# Patient Record
Sex: Female | Born: 1995 | Race: White | Hispanic: No | Marital: Single | Smoking: Never smoker
Health system: Southern US, Community
[De-identification: ages and names within clinical notes are randomized; demographics above are authoritative.]

---
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
## 2014-01-05 IMAGING — NM NM HEPATO W/GB/PHARM/[PERSON_NAME]
2 series · 18 of 18 positions shown · non-contrast
Comparison: None.

CLINICAL DATA: Right upper quadrant pain

NUCLEAR MEDICINE HEPATOBILIARY IMAGING WITH GALLBLADDER EF
TECHNIQUE: Sequential images of the abdomen were obtained [DATE] minutes following intravenous administration of
radiopharmaceutical. After oral ingestion of 8 ounces of Half-and-
Half cream, gallbladder ejection fraction was determined.
Radiopharmaceutical:  5.0 mCi 8c-WWm Choletec

[Series 0: hepato · 3.20mm/px · 6 of 60 frames shown (1 of 2)]
[frame 6/60]
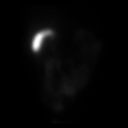
[frame 16/60]
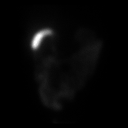
[frame 26/60]
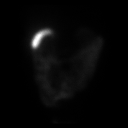
[frame 36/60]
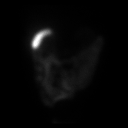
[frame 46/60]
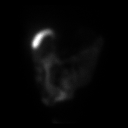
[frame 56/60]
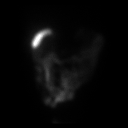

[Series 0: hepato · 3.20mm/px · 2 acquisitions, 12 frames shown (2 of 2)]
[im 1/2]
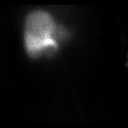
[im 1/2]
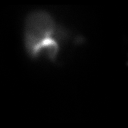
[im 1/2]
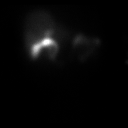
[im 1/2]
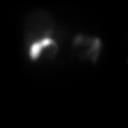
[im 1/2]
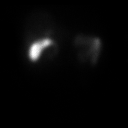
[im 1/2]
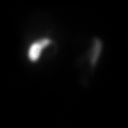
[im 2/2]
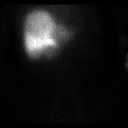
[im 2/2]
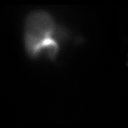
[im 2/2]
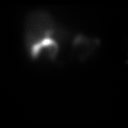
[im 2/2]
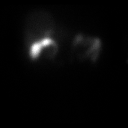
[im 2/2]
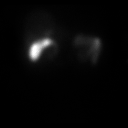
[im 2/2]
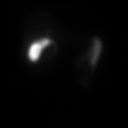

[18 of 18 positions shown; findings below may reference images not displayed]

FINDINGS: Gallbladder activity occurs after 15 minutes.  Small
bowel activity occurs after 12 minutes.  Gallbladder ejection
fraction is 28% after 55 minutes which is below normal.

The patient did not experience symptoms after oral ingestion of
Half-and-Half cream.
IMPRESSION: Cystic and common bile ducts are patent.  Gallbladder ejection
fraction is abnormally low at 28%.

## 2015-11-27 ENCOUNTER — Ambulatory Visit (INDEPENDENT_AMBULATORY_CARE_PROVIDER_SITE_OTHER): Payer: BC Managed Care – PPO | Admitting: Podiatry

## 2015-11-27 ENCOUNTER — Encounter: Payer: Self-pay | Admitting: Podiatry

## 2015-11-27 VITALS — BP 103/65 | HR 67 | Resp 16 | Ht 66.0 in | Wt 135.0 lb

## 2015-11-27 DIAGNOSIS — B351 Tinea unguium: Secondary | ICD-10-CM

## 2015-11-27 MED ORDER — TERBINAFINE HCL 250 MG PO TABS: 250.0000 mg | ORAL_TABLET | Freq: Every day | ORAL | Status: DC

## 2015-11-27 NOTE — Progress Notes (Signed)
   Subjective:    Patient ID: Carol Richardson, female    DOB: January 07, 1996, 20 y.o.   MRN: [id number]  HPI "I used to competitively swim.  I think I picked up something going barefoot, like a fungus.  My nails are discolored, thick and brittle.  I was treated here before for the same problem.  She has also tried vinegar and baking soda.."    Review of Systems     Objective:   Physical Exam        Assessment & Plan:

## 2015-12-26 ENCOUNTER — Ambulatory Visit: Payer: BC Managed Care – PPO

## 2015-12-26 DIAGNOSIS — B351 Tinea unguium: Secondary | ICD-10-CM

## 2015-12-26 NOTE — Progress Notes (Signed)
Pt presents with mycotic infection of nails Lt 1, 4th  All other systems are negative  Laser therapy administered to affected nails and tolerated well. All safety precautions were in place

## 2016-01-17 NOTE — Progress Notes (Signed)
Subjective:     Patient ID: Carol PatchesClaudia Richardson, female   DOB: [birth year], 20 y.o.   MRN: [id number]  HPI patient presents stating that she has had nail disease and has dealt with this several years ago it had been on oral antifungal agent. 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 Patient will initiate her oral medication and we'll start laser therapy approximately 1 month

## 2016-01-23 ENCOUNTER — Other Ambulatory Visit: Payer: BC Managed Care – PPO

## 2016-02-20 ENCOUNTER — Other Ambulatory Visit: Payer: BC Managed Care – PPO

## 2016-02-21 ENCOUNTER — Other Ambulatory Visit: Payer: BC Managed Care – PPO

## 2016-02-21 DIAGNOSIS — B351 Tinea unguium: Secondary | ICD-10-CM

## 2016-02-25 ENCOUNTER — Other Ambulatory Visit: Payer: BC Managed Care – PPO

## 2016-03-19 ENCOUNTER — Ambulatory Visit (INDEPENDENT_AMBULATORY_CARE_PROVIDER_SITE_OTHER): Payer: BC Managed Care – PPO

## 2016-03-19 DIAGNOSIS — B351 Tinea unguium: Secondary | ICD-10-CM

## 2016-03-20 NOTE — Progress Notes (Signed)
Pt presents with mycotic infection of nails Lt 1, 4th  All other systems are negative  Laser therapy administered to affected nails and tolerated well. All safety precautions were in place. Nail 95% clear, she is to come back as needed

## 2017-12-16 ENCOUNTER — Other Ambulatory Visit: Payer: Self-pay

## 2017-12-16 ENCOUNTER — Encounter: Payer: Self-pay | Admitting: Physician Assistant

## 2017-12-16 ENCOUNTER — Ambulatory Visit (INDEPENDENT_AMBULATORY_CARE_PROVIDER_SITE_OTHER): Payer: BC Managed Care – PPO | Admitting: Physician Assistant

## 2017-12-16 VITALS — BP 100/62 | HR 87 | Temp 98.1°F | Resp 20 | Ht 66.25 in | Wt 201.0 lb

## 2017-12-16 DIAGNOSIS — Z7689 Persons encountering health services in other specified circumstances: Secondary | ICD-10-CM

## 2017-12-16 NOTE — Progress Notes (Signed)
Patient ID: Carol Richardson MRN: [id number], DOB: [birth year], 22 y.o. Date of Encounter: @DATE @  Chief Complaint:  Chief Complaint  Patient presents with  . New Patient (Initial Visit)    HPI: 22 y.o. year old female  presents as a new patient to establish care.  Her mother had a visit with me earlier today also as a new patient to establish care.  She is very pleasant.  Mother is now here with Carol Richardson for her appointment. 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 However, she has not received/does not have the forms that need to be completed yet. She states that this upcoming Tuesday she goes to orientation--- may receive those forms and information that day.  I am going to charge her a no charge for today's visit. She will schedule another visit with me once she has the forms that need to be completed.  I have coded her is a no charge today.  Once she has the forms that need to be completed, she will schedule another visit with me. They did bring in paper copy of her immunization record from Dr. Donnie Coffinubin. Our staff will abstract [facility] epic and [facility].    I asked what she has been doing since high school.  She is now age 22.  They state that she went to [employer] for 2 years.  Also has gone to [employer] for some of her classes.  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   I asked if she had any past medical history. Mom reports that she went to Dr. Maryellen Pileavid Rubin as her pediatrician. Vomiting.  Says that they mention something about her gallbladder but that they did not have to remove her gallbladder.  Still has gallbladder.  Carol Richardson says that she avoids red meat etc. and symptoms have been controlled and has not had recurrent headaches or vomiting.  Reports that she has no other past medical history.  Has had no  surgeries. No pertinent family history.  No other concerns to address.   History reviewed. No pertinent past medical history.   Home Meds: Outpatient Medications Prior to Visit  Medication Sig Dispense Refill  . terbinafine (LAMISIL) 250 MG tablet Take 1 tablet (250 mg total) by mouth daily. (Patient not taking: Reported on 11/27/2015) 90 tablet 0  . terbinafine (LAMISIL) 250 MG tablet Take 1 tablet (250 mg total) by mouth daily. 90 tablet 0   No facility-administered medications prior to visit.     Allergies: No Known Allergies  Social History   Socioeconomic History  . Marital status: Single    Spouse name: Not on file  . Number of children: Not on file  . Years of education: Not on file  . Highest education level: Not on file  Occupational History  . Not on file  Social Needs  . Financial resource strain: Not on file  . Food insecurity:    Worry: Not on file    Inability: Not on file  . Transportation needs:    Medical: Not on file    Non-medical: Not on file  Tobacco Use  . Smoking status: Never Smoker  . Smokeless tobacco: Never Used  Substance and Sexual Activity  . Alcohol use: Never    Alcohol/week: 0.0 oz    Frequency: Never  Comment: socially  . Drug use: No  . Sexual activity: Never  Lifestyle  . Physical activity:    Days per week: Not on file    Minutes per session: Not on file  . Stress: Not on file  Relationships  . Social connections:    Talks on phone: Not on file    Gets together: Not on file    Attends religious service: Not on file    Active member of club or organization: Not on file    Attends meetings of clubs or organizations: Not on file    Relationship status: Not on file  . Intimate partner violence:    Fear of current or ex partner: Not on file    Emotionally abused: Not on file    Physically abused: Not on file    Forced sexual activity: Not on file  Other Topics Concern  . Not on file  Social History Narrative  . Not  on file    Family History  Problem Relation Age of Onset  . Hyperlipidemia Maternal Grandfather   . Cancer Paternal Grandmother      Review of Systems:  See HPI for pertinent ROS. All other ROS negative.    Physical Exam: Blood pressure 100/62, pulse 87, temperature 98.1 F (36.7 C), temperature source Oral, resp. rate 20, height 5' 6.25" (1.683 m), weight 91.2 kg (201 lb), last menstrual period 11/15/2017, SpO2 97 %., Body mass index is 32.2 kg/m. General:  WF. Appears in no acute distress. Neck: Supple. No thyromegaly. No lymphadenopathy. Lungs: Clear bilaterally to auscultation without wheezes, rales, or rhonchi. Breathing is unlabored. Heart: RRR with S1 S2. No murmurs, rubs, or gallops. Musculoskeletal:  Strength and tone normal for age. Extremities/Skin: Warm and dry.  Neuro: Alert and oriented X 3. Moves all extremities spontaneously. Gait is normal. CNII-XII grossly in tact. Psych:  Responds to questions appropriately with a normal affect.     ASSESSMENT AND PLAN:  22 y.o. year old female with   1. Encounter to establish care I have coded her is a no charge today.  Once she has the forms that need to be completed, she will schedule another visit with me. They did bring in paper copy of her immunization record from Dr. Donnie Coffin. Our staff will abstract this into epic and [facility] registry.   Signed, [address] [place], [place], [facility] 12/16/2017 11:26 AM

## 2017-12-23 ENCOUNTER — Ambulatory Visit: Payer: BC Managed Care – PPO | Admitting: Physician Assistant

## 2017-12-23 ENCOUNTER — Encounter: Payer: Self-pay | Admitting: Physician Assistant

## 2017-12-23 VITALS — BP 100/78 | HR 82 | Temp 98.7°F | Resp 18 | Ht 66.25 in | Wt 204.6 lb

## 2017-12-23 DIAGNOSIS — Z0289 Encounter for other administrative examinations: Secondary | ICD-10-CM

## 2017-12-23 DIAGNOSIS — Z0000 Encounter for general adult medical examination without abnormal findings: Secondary | ICD-10-CM | POA: Diagnosis not present

## 2017-12-23 DIAGNOSIS — Z020 Encounter for examination for admission to educational institution: Secondary | ICD-10-CM

## 2017-12-23 NOTE — Progress Notes (Signed)
Patient ID: Ernestina PatchesClaudia Piatkowski MRN: [id number], DOB: [birth year], 22 y.o. Date of Encounter: @DATE @  Chief Complaint:  Chief Complaint  Patient presents with  . need forms for school filled out    HPI: 22 y.o. year old female  Presents with above.    12/16/2017: presents as a new patient to establish care.  Her mother had a visit with me earlier today also as a new patient to establish care.  She is very pleasant.  Mother is now here with Debarah Crapelaudia for her appointment. 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 However, she has not received/does not have the forms that need to be completed yet. She states that this upcoming Tuesday she goes to orientation--- may receive those forms and information that day.  I am going to charge her a no charge for today's visit. She will schedule another visit with me once she has the forms that need to be completed.  I have coded her is a no charge today.  Once she has the forms that need to be completed, she will schedule another visit with me. They did bring in paper copy of her immunization record from Dr. Donnie Coffinubin. Our staff will abstract this into epic and [facility].    I asked what she has been doing since high school.  She is now age 22.  They state that she went to [employer] for 2 years.  Also has gone to [employer] for some of her classes.  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   I asked if she had any past medical history. Mom reports that she went to Dr. Maryellen Pileavid Rubin as her pediatrician. Vomiting.  Says that they mention something about her gallbladder but that they did not have to remove her gallbladder.  Still has gallbladder.  Debarah CrapeClaudia says that she avoids red meat etc. and symptoms have been controlled and has not had recurrent headaches or vomiting.  Reports that she has  no other past medical history.  Has had no surgeries. No pertinent family history.  No other concerns to address.  A/P AT THAT OV: 1. Encounter to establish care I have coded her is a no charge today.  Once she has the forms that need to be completed, she will schedule another visit with me. They did bring in paper copy of her immunization record from Dr. Donnie Coffinubin. Our staff will abstract this into epic and [facility] registry.    12/23/2017:  Today she returns. She has forms for school.  No other concerns to address.      History reviewed. No pertinent past medical history.   Home Meds: Outpatient Medications Prior to Visit  Medication Sig Dispense Refill  . ASPIRIN ADULT PO Take by mouth.    . loratadine (CLARITIN) 10 MG tablet Take 10 mg by mouth daily.     No facility-administered medications prior to visit.     Allergies: No Known Allergies  Social History   Socioeconomic History  . Marital status: Single    Spouse name: Not on file  . Number of children: Not on file  . Years of education: Not on file  . Highest education level: Not on file  Occupational History  . Not on file  Social Needs  . Financial resource strain: Not on file  .  Food insecurity:    Worry: Not on file    Inability: Not on file  . Transportation needs:    Medical: Not on file    Non-medical: Not on file  Tobacco Use  . Smoking status: Never Smoker  . Smokeless tobacco: Never Used  Substance and Sexual Activity  . Alcohol use: Never    Alcohol/week: 0.0 oz    Frequency: Never    Comment: socially  . Drug use: No  . Sexual activity: Never  Lifestyle  . Physical activity:    Days per week: Not on file    Minutes per session: Not on file  . Stress: Not on file  Relationships  . Social connections:    Talks on phone: Not on file    Gets together: Not on file    Attends religious service: Not on file    Active member of club or organization: Not on file    Attends meetings  of clubs or organizations: Not on file    Relationship status: Not on file  . Intimate partner violence:    Fear of current or ex partner: Not on file    Emotionally abused: Not on file    Physically abused: Not on file    Forced sexual activity: Not on file  Other Topics Concern  . Not on file  Social History Narrative  . Not on file    Family History  Problem Relation Age of Onset  . Hyperlipidemia Maternal Grandfather   . Cancer Paternal Grandmother      Review of Systems:  See HPI for pertinent ROS. All other ROS negative.    Physical Exam: Blood pressure 100/78, pulse 82, temperature 98.7 F (37.1 C), temperature source Oral, resp. rate 18, height 5' 6.25" (1.683 m), weight 92.8 kg (204 lb 9.6 oz), last menstrual period 11/22/2017, SpO2 98 %., Body mass index is 32.77 kg/m. General: WF. Appears in no acute distress. Head: Normocephalic, atraumatic, eyes without discharge, sclera non-icteric, nares are without discharge. Bilateral auditory canals clear, TM's are without perforation, pearly grey and translucent with reflective cone of light bilaterally. Oral cavity moist, posterior pharynx without erythema. Neck: Supple. No thyromegaly. No lymphadenopathy. Lungs: Clear bilaterally to auscultation without wheezes, rales, or rhonchi. Breathing is unlabored. Heart: RRR with S1 S2. No murmurs, rubs, or gallops. Abdomen: Soft, non-tender, non-distended with normoactive bowel sounds. No hepatomegaly. No rebound/guarding. No obvious abdominal masses. Musculoskeletal:  Strength and tone normal for age. Extremities/Skin: Warm and dry. No clubbing or cyanosis. No edema. No rashes or suspicious lesions. Neuro: Alert and oriented X 3. Moves all extremities spontaneously. Gait is normal. CNII-XII grossly in tact. Psych:  Responds to questions appropriately with a normal affect.     ASSESSMENT AND PLAN:  22 y.o. year old female with    1. Encounter for completion of form with  patient 2. Encounter for school history and physical examination She has completed personal and family history portion of form.  Hearing and Vision screens today are normal. Immunizations are up-to-date.  Check Quantiferon--TB Gold. Remainder of form completed. Will f/u TB lab result.  - QuantiFERON-TB Gold Plus   Signed, [address] [place], [place], [facility] 12/23/2017 7:02 PM

## 2017-12-25 LAB — QUANTIFERON-TB GOLD PLUS
NIL: 0.03 IU/mL
QuantiFERON-TB Gold Plus: NEGATIVE
TB1-NIL: 0.02 [IU]/mL
TB2-NIL: 0.03 [IU]/mL

## 2018-01-05 ENCOUNTER — Telehealth: Payer: Self-pay

## 2018-01-05 NOTE — Telephone Encounter (Signed)
Call placed to patient to inform her that her school forms as well as her immunization records are available for pick up at the front desk.

## 2018-01-10 ENCOUNTER — Ambulatory Visit: Payer: BC Managed Care – PPO | Admitting: Physician Assistant

## 2018-01-10 ENCOUNTER — Encounter: Payer: Self-pay | Admitting: Physician Assistant

## 2018-01-10 VITALS — BP 108/78 | HR 83 | Temp 98.2°F | Resp 20 | Ht 66.25 in | Wt 202.2 lb

## 2018-01-10 DIAGNOSIS — J029 Acute pharyngitis, unspecified: Secondary | ICD-10-CM

## 2018-01-10 DIAGNOSIS — J988 Other specified respiratory disorders: Secondary | ICD-10-CM

## 2018-01-10 DIAGNOSIS — B9689 Other specified bacterial agents as the cause of diseases classified elsewhere: Secondary | ICD-10-CM

## 2018-01-10 MED ORDER — AZITHROMYCIN 250 MG PO TABS: ORAL_TABLET | ORAL | 0 refills | Status: AC

## 2018-01-10 NOTE — Progress Notes (Signed)
    Patient ID: Carol PatchesClaudia Richardson MRN: [id number], DOB: Nov 11, 1995, 22 y.o. Date of Encounter: 01/10/2018, 9:30 AM    Chief Complaint:  Chief Complaint  Patient presents with  . Sore Throat    symptoms for 3 days   . Cough    phlegm green     HPI: 22 y.o. year old female presents with above.   Her mom accompanies her for visit.  Reports that patient's father has also been sick for the last 5 days with congestion etc.  Thinks that patient has picked up germ from him.  Dates that she is coughing up thick yellow-green phlegm and having some nasal congestion and thick mucus from the nose.  Also with her throat has been sore for several days.  She states that she is taking a Mucinex that is for cough.  Had no known fevers or chills.  No other concerns to address.     Home Meds:   Outpatient Medications Prior to Visit  Medication Sig Dispense Refill  . ASPIRIN ADULT PO Take by mouth.    . loratadine (CLARITIN) 10 MG tablet Take 10 mg by mouth daily.     No facility-administered medications prior to visit.     Allergies: No Known Allergies    Review of Systems: See HPI for pertinent ROS. All other ROS negative.    Physical Exam: Blood pressure 108/78, pulse 83, temperature 98.2 F (36.8 C), temperature source Oral, resp. rate 20, height 5' 6.25" (1.683 m), weight 91.7 kg (202 lb 3.2 oz), last menstrual period 12/27/2017, SpO2 94 %., There is no height or weight on file to calculate BMI. General:  WNWD WF. Appears in no acute distress. HEENT: Normocephalic, atraumatic, eyes without discharge, sclera non-icteric, nares are without discharge. Bilateral auditory canals clear, TM's are without perforation, pearly grey and translucent with reflective cone of light bilaterally. Oral cavity moist, posterior pharynx with moderate erythema of the posterior pharynx and bilateral pharynx.  There is no exudate, no peritonsillar abscess. Neck: Supple. No thyromegaly. No lymphadenopathy. Lungs:  Clear bilaterally to auscultation without wheezes, rales, or rhonchi. Breathing is unlabored. Heart: Regular rhythm. No murmurs, rubs, or gallops. Msk:  Strength and tone normal for age. Extremities/Skin: Warm and dry. No rash. Neuro: Alert and oriented X 3. Moves all extremities spontaneously. Gait is normal. CNII-XII grossly in tact. Psych:  Responds to questions appropriately with a normal affect.     ASSESSMENT AND PLAN:  22 y.o. year old female with   1. Bacterial respiratory infection Discussed that rapid strep test negative.  Symptoms and findings consistent with bacterial respiratory infection.  She is to take the antibiotic as directed to kill off infection.  Can continue over-the-counter medicines for symptom relief if needed in the interim.  Follow-up if symptoms do not resolve within 1 week after completion of antibiotics. - azithromycin (ZITHROMAX) 250 MG tablet; Day 1: Take 2 daily.  Days 2 -5: Take 1 daily.  Dispense: 6 tablet; Refill: 0  2. Sore throat Rapid strep test negative. - STREP GROUP A AG, W/REFLEX TO CULT    Signed, [address], [place], [facility] 01/10/2018 9:30 AM

## 2018-01-12 LAB — CULTURE, GROUP A STREP
MICRO NUMBER: 90834491
SPECIMEN QUALITY: ADEQUATE

## 2018-01-12 LAB — STREP GROUP A AG, W/REFLEX TO CULT: STREPTOCOCCUS, GROUP A SCREEN (DIRECT): NOT DETECTED

## 2019-03-28 ENCOUNTER — Other Ambulatory Visit: Payer: Self-pay

## 2019-03-28 ENCOUNTER — Other Ambulatory Visit: Payer: BC Managed Care – PPO

## 2019-03-28 DIAGNOSIS — Z111 Encounter for screening for respiratory tuberculosis: Secondary | ICD-10-CM

## 2019-03-30 LAB — QUANTIFERON-TB GOLD PLUS
Mitogen-NIL: >10 IU/mL
NIL: 0.03 IU/mL
QuantiFERON-TB Gold Plus: NEGATIVE
TB1-NIL: 0.05 IU/mL
TB2-NIL: 0.07 IU/mL

## 2019-04-13 ENCOUNTER — Encounter: Payer: Self-pay | Admitting: *Deleted

## 2019-10-20 ENCOUNTER — Ambulatory Visit: Payer: Self-pay | Attending: Internal Medicine

## 2019-10-20 DIAGNOSIS — Z23 Encounter for immunization: Secondary | ICD-10-CM

## 2019-10-20 NOTE — Progress Notes (Signed)
   Covid-19 Vaccination Clinic  Name:  Carol Richardson    MRN: [id number] DOB: [birth year]  10/20/2019  [name] was observed post Covid-19 immunization for 15 minutes without incident. She was provided with Vaccine Information Sheet and instruction to access the V-Safe system.   [name] was instructed to call 911 with any severe reactions post vaccine: Marland Kitchen Difficulty breathing  . Swelling of face and throat  . A fast heartbeat  . A bad rash all over body  . Dizziness and weakness   Immunizations Administered    Name Date Dose VIS Date Route   Pfizer COVID-19 Vaccine 10/20/2019 11:23 AM 0.3 mL 08/23/2018 Intramuscular   Manufacturer: [employer], [employer]   Lot: [id number]   NDC: [address]-6

## 2019-11-13 ENCOUNTER — Ambulatory Visit: Payer: Self-pay | Attending: Internal Medicine

## 2019-11-13 DIAGNOSIS — Z23 Encounter for immunization: Secondary | ICD-10-CM

## 2019-11-13 NOTE — Progress Notes (Signed)
   Covid-19 Vaccination Clinic  Name:  Carol Richardson    MRN: [id number] DOB: Jul 29, 1995  11/13/2019  [name] was observed post Covid-19 immunization for 15 minutes without incident. She was provided with Vaccine Information Sheet and instruction to access the V-Safe system.   [name] was instructed to call 911 with any severe reactions post vaccine: Marland Kitchen Difficulty breathing  . Swelling of face and throat  . A fast heartbeat  . A bad rash all over body  . Dizziness and weakness   Immunizations Administered    Name Date Dose VIS Date Route   Pfizer COVID-19 Vaccine 11/13/2019 11:17 AM 0.3 mL 08/23/2018 Intramuscular   Manufacturer: [employer], [employer]   Lot: [id number]   NDC: [address]-0

## 2020-02-05 ENCOUNTER — Encounter: Payer: Self-pay | Admitting: Podiatry

## 2020-02-05 ENCOUNTER — Other Ambulatory Visit: Payer: Self-pay

## 2020-02-05 ENCOUNTER — Ambulatory Visit: Payer: BC Managed Care – PPO | Admitting: Podiatry

## 2020-02-05 DIAGNOSIS — B351 Tinea unguium: Secondary | ICD-10-CM | POA: Diagnosis not present

## 2020-02-05 MED ORDER — TERBINAFINE HCL 250 MG PO TABS: 250.0000 mg | ORAL_TABLET | Freq: Every day | ORAL | 0 refills | Status: AC

## 2020-02-05 NOTE — Progress Notes (Signed)
Subjective:   Patient ID: Carol Richardson, female   DOB: 24 y.o.   MRN: [id number]   HPI Patient presents stating I have thickness of my right big toenail its been going on for about 4 years and I had previous laser about 4 years ago and I am interested in treatment for this condition   Review of Systems  All other systems reviewed and are negative.       Objective:  Physical Exam Vitals and nursing note reviewed.  Constitutional:      Appearance: She is well-developed.  Pulmonary:     Effort: Pulmonary effort is normal.  Musculoskeletal:        General: Normal range of motion.  Skin:    General: Skin is warm.  Neurological:     Mental Status: She is alert.     Neurovascular status intact muscle strength adequate with patient found to have a thickened right hallux nail that is been probably traumatized about 4 years ago with the distal one half of the nailbed involved.  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  We discussed different treatment options and she is opted for oral and laser and I educated her on this and she is scheduled for laser and I wrote her prescription for Lamisil 1 pill/day for 90 days explaining risk with this and I am sending for liver function studies now

## 2020-02-07 LAB — HEPATIC FUNCTION PANEL
AG Ratio: 1.9 (calc) (ref 1.0–2.5)
ALT: 20 U/L (ref 6–29)
AST: 16 U/L (ref 10–30)
Albumin: 4.3 g/dL (ref 3.6–5.1)
Alkaline phosphatase (APISO): 97 U/L (ref 31–125)
Bilirubin, Direct: 0.1 mg/dL (ref 0.0–0.2)
Globulin: 2.3 g/dL (calc) (ref 1.9–3.7)
Indirect Bilirubin: 0.4 mg/dL (calc) (ref 0.2–1.2)
Total Bilirubin: 0.5 mg/dL (ref 0.2–1.2)
Total Protein: 6.6 g/dL (ref 6.1–8.1)

## 2020-02-13 ENCOUNTER — Telehealth: Payer: Self-pay | Admitting: Podiatry

## 2020-02-13 NOTE — Telephone Encounter (Signed)
Patient was seen in office on 02/05/2020 for nail fungus and was sent for lab work to test her levels before beginning Lamisil. Patient would like to know if the results are back in and if she is able to start the medication.  Please give patient a call.

## 2020-02-14 ENCOUNTER — Telehealth: Payer: Self-pay | Admitting: *Deleted

## 2020-02-14 NOTE — Telephone Encounter (Signed)
Called patient back to let her know her lab results (hepatic panel) came back normal and that she could start her Lamisil medicine now.  Patient stated, "I am going to the beach next week and it says that Lamisil is sensitive to the sun". I advised patient to hold off taking her Lamisil until she gets back from the beach.  Patient stated she understood.

## 2020-02-15 ENCOUNTER — Ambulatory Visit (INDEPENDENT_AMBULATORY_CARE_PROVIDER_SITE_OTHER): Payer: BC Managed Care – PPO | Admitting: *Deleted

## 2020-02-15 ENCOUNTER — Other Ambulatory Visit: Payer: Self-pay

## 2020-02-15 DIAGNOSIS — B351 Tinea unguium: Secondary | ICD-10-CM

## 2020-02-15 NOTE — Progress Notes (Signed)
Patient presents today for the 1st (2nd round) laser treatment. Diagnosed with mycotic nail infection by Dr. Charlsie Merles.   Toenail most affected hallux right.  All other systems are negative.  Nails were filed thin. Laser therapy was administered to 1st toenails right and patient tolerated the treatment well. All safety precautions were in place.   Patient was prescribed terbinafine #90, but has not started yet. She wants to wait until she gets back from the beach.   Follow up in 6 weeks for laser # 2.  Picture of nails taken today to document visual progress  ~Dr. Charlsie Merles wants patient to have 3 lasers total~

## 2020-03-05 ENCOUNTER — Ambulatory Visit: Payer: BC Managed Care – PPO | Admitting: Family Medicine

## 2020-03-18 ENCOUNTER — Other Ambulatory Visit: Payer: Self-pay

## 2020-03-18 ENCOUNTER — Ambulatory Visit (INDEPENDENT_AMBULATORY_CARE_PROVIDER_SITE_OTHER): Payer: BC Managed Care – PPO | Admitting: *Deleted

## 2020-03-18 DIAGNOSIS — B351 Tinea unguium: Secondary | ICD-10-CM

## 2020-03-18 NOTE — Progress Notes (Signed)
Patient presents today for the 2nd (2nd round) laser treatment. Diagnosed with mycotic nail infection by Dr. Charlsie Merles.   Toenail most affected hallux right.  All other systems are negative.  Nails were filed thin. Laser therapy was administered to 1st toenails right and patient tolerated the treatment well. All safety precautions were in place.   Patient was prescribed terbinafine #90. She has started the medication. She delayed in starting it until she come back from her beach trip.   Follow up in 6 weeks for laser # 3.   ~Dr. Charlsie Merles wants patient to have 3 lasers total~

## 2020-04-12 ENCOUNTER — Ambulatory Visit (INDEPENDENT_AMBULATORY_CARE_PROVIDER_SITE_OTHER): Payer: BC Managed Care – PPO | Admitting: *Deleted

## 2020-04-12 ENCOUNTER — Other Ambulatory Visit: Payer: Self-pay

## 2020-04-12 DIAGNOSIS — B351 Tinea unguium: Secondary | ICD-10-CM

## 2020-04-12 NOTE — Progress Notes (Signed)
Patient presents today for the 3rd (2nd round) laser treatment. Diagnosed with mycotic nail infection by Dr. Charlsie Merles.   Toenail most affected hallux right. The nail is clearing up quite a bit.  All other systems are negative.  Nails were filed thin. Laser therapy was administered to 1st toenails right and patient tolerated the treatment well. All safety precautions were in place.   Patient was prescribed terbinafine #90. She has started the medication. She delayed in starting it until she come back from her beach trip. She has about 2 months of the medication left.  Patient has completed the recommended laser treatments. She will follow up with Dr. Charlsie Merles in 3 months to evaluate progress.    ~Final nail pic taken today~

## 2020-07-18 ENCOUNTER — Encounter: Payer: Self-pay | Admitting: Podiatry

## 2020-07-18 ENCOUNTER — Other Ambulatory Visit: Payer: Self-pay

## 2020-07-18 ENCOUNTER — Ambulatory Visit: Payer: BC Managed Care – PPO | Admitting: Podiatry

## 2020-07-18 DIAGNOSIS — B351 Tinea unguium: Secondary | ICD-10-CM

## 2020-07-18 NOTE — Progress Notes (Signed)
Subjective:   Patient ID: Carol Richardson, female   DOB: 25 y.o.   MRN: [id number]   HPI Patient states she thinks her nail has improved but she still has a patch that bothers her and is concerned   ROS      Objective:  Physical Exam  Neurovascular status intact with patient's right hallux on the lateral side still exhibiting some discoloration and slight thickness with localized irritation of the tissue.  Is not having pain and completed her oral antifungal several months ago     Assessment:  Patient is doing well with mycotic infection of the right hallux lateral side with significant improvement from the beginning of treatment     Plan:  Reviewed with patient and at 1 point in future we may need to consider a pulse therapy.  At this point we are going to have 1 more laser done and she will begin topical formula 7 we will give it 4 months and if symptoms are persisting we will consider oral medication pulse

## 2020-07-30 ENCOUNTER — Telehealth: Payer: Self-pay | Admitting: *Deleted

## 2020-07-30 NOTE — Telephone Encounter (Signed)
Patient is calling to let Dr Charlsie Merles know that she has stop using the formula #7 topical after noticing burning all night, discoloration,inflammation,swelling and bruising around the actual skin after applying.This lasted about 1 week. Please advise.

## 2020-08-16 ENCOUNTER — Other Ambulatory Visit: Payer: Self-pay

## 2020-08-16 ENCOUNTER — Ambulatory Visit (INDEPENDENT_AMBULATORY_CARE_PROVIDER_SITE_OTHER): Payer: BC Managed Care – PPO | Admitting: *Deleted

## 2020-08-16 DIAGNOSIS — B351 Tinea unguium: Secondary | ICD-10-CM

## 2020-08-16 NOTE — Progress Notes (Signed)
Patient presents today for the 4th (2nd round) laser treatment. Diagnosed with mycotic nail infection by Dr. Charlsie Merles.   Toenail most affected hallux right. She saw Dr. Charlsie Merles for follow up and still has come discoloration in the nail. He wanted her to try Formula 7 and one more laser treatment. However, patient states when using for 1 week she had a lot of irritation and redness to the medial border of the toe.  All other systems are negative.  Nails were filed thin. Laser therapy was administered to 1st toenails right and patient tolerated the treatment well. All safety precautions were in place.   The medial border is very sore and red. I did cut a little portion of nail. I told her this looked like more of an ingrown vs irritation from the topical medication. Advised she could soak and use neosporin on the toe daily. If no better in a few weeks call and schedule an appointment with Dr. Charlsie Merles.   She will follow up for possible laser treatment in 8 weeks.  ~Pic of nail taken today~

## 2020-10-18 ENCOUNTER — Other Ambulatory Visit: Payer: BC Managed Care – PPO

## 2020-10-24 ENCOUNTER — Other Ambulatory Visit: Payer: Self-pay | Admitting: Podiatry

## 2020-10-24 NOTE — Telephone Encounter (Signed)
Please advise 

## 2020-10-24 NOTE — Telephone Encounter (Signed)
Do not refill again

## 2020-10-25 ENCOUNTER — Other Ambulatory Visit: Payer: Self-pay

## 2020-10-25 ENCOUNTER — Ambulatory Visit (INDEPENDENT_AMBULATORY_CARE_PROVIDER_SITE_OTHER): Payer: BC Managed Care – PPO

## 2020-10-25 DIAGNOSIS — B351 Tinea unguium: Secondary | ICD-10-CM

## 2020-10-25 NOTE — Progress Notes (Signed)
Patient presents today for the 5th (2nd round) laser treatment. Diagnosed with mycotic nail infection by Dr. Charlsie Merles.   Toenail most affected hallux right. She saw Dr. Charlsie Merles for follow up and still has come discoloration in the nail. He wanted her to try Formula 7 and one more laser treatment. However, patient states when using for 1 week she had a lot of irritation and redness to the medial border of the toe.  All other systems are negative.  Nails were filed thin. Laser therapy was administered to 1st toenails right and patient tolerated the treatment well. All safety precautions were in place.   The medial border is very sore and red. I did cut a little portion of nail. I told her this looked like more of an ingrown vs irritation from the topical medication. Advised she could soak and use neosporin on the toe daily. If no better in a few weeks call and schedule an appointment with Dr. Charlsie Merles.   Follow up with Dr Charlsie Merles in 3 months

## 2020-10-25 NOTE — Patient Instructions (Signed)

## 2020-10-28 ENCOUNTER — Other Ambulatory Visit: Payer: BC Managed Care – PPO

## 2020-10-28 ENCOUNTER — Telehealth: Payer: Self-pay | Admitting: Podiatry

## 2020-10-28 NOTE — Telephone Encounter (Signed)
Patient had a laser done in the office on Friday, she stated that her toe is red and puffy around the nail area where it was cut. Patient states it hurts to walk Please call patient.

## 2020-10-29 NOTE — Telephone Encounter (Signed)
Patient has complained about the same problem the last 2 laser treatments and was advised to make an appointment with the provider if soaking in epsom salt did not relieve the discomfort. The redness and swollen area may be due to an ingrown toenail. She needs to be evaluated by one of our providers.

## 2020-10-31 ENCOUNTER — Other Ambulatory Visit: Payer: Self-pay

## 2020-10-31 ENCOUNTER — Ambulatory Visit (INDEPENDENT_AMBULATORY_CARE_PROVIDER_SITE_OTHER): Payer: BC Managed Care – PPO | Admitting: Podiatry

## 2020-10-31 DIAGNOSIS — B351 Tinea unguium: Secondary | ICD-10-CM

## 2020-10-31 MED ORDER — TERBINAFINE HCL 250 MG PO TABS: 250.0000 mg | ORAL_TABLET | Freq: Every day | ORAL | 0 refills | Status: AC

## 2020-10-31 NOTE — Patient Instructions (Signed)

## 2020-11-04 NOTE — Progress Notes (Signed)
Subjective:   Patient ID: Carol Richardson, female   DOB: 25 y.o.   MRN: [id number]   HPI Patient states she has some irritation around the right big toe with redness noted and has an incurvated nail border that also can be bothersome with history of fungal infiltration with   ROS      Objective:  Physical Exam  Neurovascular status intact with patient right hallux healing slight redness in the corner localized no active drainage noted     Assessment:  Fungal infection irritation around the tissue with possible ingrown toenail deformity     Plan:  I went ahead today I placed her on antifungal Lamisil and advised this just to be done on a pulsed fashion along with soaks reappoint as needed

## 2021-01-20 ENCOUNTER — Ambulatory Visit: Payer: BC Managed Care – PPO | Admitting: Podiatry

## 2021-07-16 ENCOUNTER — Telehealth: Payer: Self-pay

## 2021-07-16 NOTE — Telephone Encounter (Signed)
error 

## 2022-11-13 DIAGNOSIS — Z0000 Encounter for general adult medical examination without abnormal findings: Secondary | ICD-10-CM | POA: Diagnosis not present

## 2022-11-13 DIAGNOSIS — Z13220 Encounter for screening for lipoid disorders: Secondary | ICD-10-CM | POA: Diagnosis not present

## 2022-11-13 DIAGNOSIS — E282 Polycystic ovarian syndrome: Secondary | ICD-10-CM | POA: Diagnosis not present

## 2022-11-13 DIAGNOSIS — B351 Tinea unguium: Secondary | ICD-10-CM | POA: Diagnosis not present

## 2023-04-10 DIAGNOSIS — Z23 Encounter for immunization: Secondary | ICD-10-CM | POA: Diagnosis not present

## 2023-06-28 DIAGNOSIS — J038 Acute tonsillitis due to other specified organisms: Secondary | ICD-10-CM | POA: Diagnosis not present

## 2023-11-19 DIAGNOSIS — Z13220 Encounter for screening for lipoid disorders: Secondary | ICD-10-CM | POA: Diagnosis not present

## 2023-11-19 DIAGNOSIS — Z124 Encounter for screening for malignant neoplasm of cervix: Secondary | ICD-10-CM | POA: Diagnosis not present

## 2023-12-27 DIAGNOSIS — L239 Allergic contact dermatitis, unspecified cause: Secondary | ICD-10-CM | POA: Diagnosis not present
